# Patient Record
Sex: Female | Born: 2000 | Race: Black or African American | Hispanic: No | Marital: Single | Smoking: Current every day smoker
Health system: Southern US, Community
[De-identification: ages and names within clinical notes are randomized; demographics above are authoritative.]

## PROBLEM LIST (undated history)

## (undated) DIAGNOSIS — F99 Mental disorder, not otherwise specified: Secondary | ICD-10-CM

## (undated) DIAGNOSIS — O039 Complete or unspecified spontaneous abortion without complication: Secondary | ICD-10-CM

## (undated) DIAGNOSIS — J45909 Unspecified asthma, uncomplicated: Secondary | ICD-10-CM

## (undated) HISTORY — DX: Mental disorder, not otherwise specified: F99

## (undated) HISTORY — DX: Complete or unspecified spontaneous abortion without complication: O03.9

---
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
## 2020-02-11 ENCOUNTER — Other Ambulatory Visit: Payer: Self-pay

## 2020-02-11 ENCOUNTER — Emergency Department (HOSPITAL_COMMUNITY)
Admission: EM | Admit: 2020-02-11 | Discharge: 2020-02-11 | Disposition: A | Discharge status: Home / Self Care | Payer: Medicaid Other | Attending: Emergency Medicine | Admitting: Emergency Medicine

## 2020-02-11 ENCOUNTER — Emergency Department (HOSPITAL_COMMUNITY): Payer: Medicaid Other

## 2020-02-11 DIAGNOSIS — R0602 Shortness of breath: Secondary | ICD-10-CM | POA: Diagnosis not present

## 2020-02-11 DIAGNOSIS — R509 Fever, unspecified: Secondary | ICD-10-CM | POA: Diagnosis not present

## 2020-02-11 DIAGNOSIS — Z1152 Encounter for screening for COVID-19: Secondary | ICD-10-CM

## 2020-02-11 DIAGNOSIS — Z20822 Contact with and (suspected) exposure to covid-19: Secondary | ICD-10-CM | POA: Insufficient documentation

## 2020-02-11 DIAGNOSIS — R197 Diarrhea, unspecified: Secondary | ICD-10-CM | POA: Diagnosis not present

## 2020-02-11 DIAGNOSIS — J029 Acute pharyngitis, unspecified: Secondary | ICD-10-CM | POA: Insufficient documentation

## 2020-02-11 DIAGNOSIS — R000 Tachycardia, unspecified: Secondary | ICD-10-CM | POA: Diagnosis not present

## 2020-02-11 DIAGNOSIS — Z87891 Personal history of nicotine dependence: Secondary | ICD-10-CM | POA: Diagnosis not present

## 2020-02-11 DIAGNOSIS — J3489 Other specified disorders of nose and nasal sinuses: Secondary | ICD-10-CM | POA: Diagnosis not present

## 2020-02-11 DIAGNOSIS — M791 Myalgia, unspecified site: Secondary | ICD-10-CM | POA: Diagnosis not present

## 2020-02-11 DIAGNOSIS — J45909 Unspecified asthma, uncomplicated: Secondary | ICD-10-CM | POA: Diagnosis not present

## 2020-02-11 DIAGNOSIS — R0981 Nasal congestion: Secondary | ICD-10-CM | POA: Diagnosis not present

## 2020-02-11 LAB — POC SARS CORONAVIRUS 2 AG -  ED: SARS Coronavirus 2 Ag: NEGATIVE

## 2020-02-11 MED ORDER — ACETAMINOPHEN 325 MG PO TABS
650.0000 mg | ORAL_TABLET | Freq: Once | ORAL | Status: AC
  Administered 2020-02-11: 650 mg via ORAL
  Filled 2020-02-11: qty 2

## 2020-02-11 NOTE — ED Triage Notes (Signed)
Possible covid

## 2020-02-11 NOTE — Discharge Instructions (Addendum)
Your chest x-ray today did not show evidence of pneumonia.  Your Covid test is pending.  The results should be back within 24 hours.  You may review the results on MyChart app or someone from the hospital will likely contact you if your results are positive.  Please isolate at home for 5 days if your test is positive.  On day 5, if you are feeling better and no longer have a fever (without taking medication to reduce your fever) you may return to normal activities but continue to wear a mask around others.  If on day 5 you are still having symptoms you will need to isolate at home for the full 10 days.  You may take over-the-counter Imodium for your diarrhea symptoms and I would recommend alternating Tylenol and ibuprofen every 4 and 6 hours for fever and body aches.  Return emergency department if you develop any worsening symptoms such as shortness of breath.

## 2020-02-11 NOTE — ED Notes (Signed)
Ambulated in room Sats 100% on RA.

## 2020-02-11 NOTE — ED Provider Notes (Signed)
[facility] EMERGENCY DEPARTMENT Provider Note   CSN: [id number] Arrival date & time: 02/11/20  1354     History No chief complaint on file.   Darlene Stout is a 20 y.o. female.  HPI      Darlene Stout is a 20 y.o. female who presents to the Emergency Department complaining of 3 to 4-day history of fever, sore throat, nasal congestion, rhinorrhea and generalized body aches.  Developed diarrhea yesterday.  Intermittently taking ibuprofen with minimal relief.  States that she has some difficulty sleeping due to sweats and body aches.  Drinking small amounts of fluids, has decreased appetite.  Stools have been watery and brown in color.  Nonbloody or black.  No vomiting or abdominal pain.  She also reports having occasional  shortness of breath with exertion.  No chest pain, cough or wheezing.  No history of asthma.  Concerned that she may have Covid.  Denies known exposure.  She is not vaccinated against COVID-19.   Past Medical History:  Diagnosis Date  . Asthma   . Mental disorder    depressed at times  . Miscarriage     Patient Active Problem List   Diagnosis Date Noted  . History of miscarriage 08/22/2016  . Encounter for other contraceptive management 08/22/2016  . Mood disorder (HCC) 10/01/2014    No past surgical history on file.   OB History    Gravida  1   Para  0   Term  0   Preterm  0   AB  1   Living  0     SAB  1   IAB  0   Ectopic  0   Multiple  0   Live Births  0           Family History  Problem Relation Age of Onset  . Asthma Mother   . Lupus Mother   . Diabetes Mother   . Hypertension Mother   . Pancreatitis Mother   . Healthy Sister   . Diabetes Maternal Grandmother   . Hypertension Maternal Grandmother   . Hypertension Maternal Grandfather   . Healthy Sister   . Healthy Brother     Social History   Tobacco Use  . Smoking status: Former Smoker    Types: Cigarettes  . Smokeless tobacco: Never Used   Substance Use Topics  . Alcohol use: No  . Drug use: No    Types: Marijuana    Home Medications Prior to Admission medications   Medication Sig Start Date End Date Taking? Authorizing Provider  ferrous sulfate (SLOW IRON) 160 (50 Fe) MG TBCR SR tablet Take 1 tablet (160 mg total) by mouth daily. 10/17/19   Harris, Cammy Copa, PA-C  ibuprofen (ADVIL,MOTRIN) 400 MG tablet Take 1 tablet (400 mg total) by mouth every 6 (six) hours as needed. 06/27/16   Linwood Dibbles, MD  megestrol (MEGACE) 40 MG tablet 3 tabs a day for 5 days(all at once) 2 tabs a day for 5 days(all at once) then 1 tablet a day 10/17/19   Arthor Captain, PA-C  tranexamic acid (LYSTEDA) 650 MG TABS tablet Take 2 tablets 3 times a day for 5 days , 2 tablets 2 times a day for 5 days, and then 1 tablet daily. 10/17/19   Arthor Captain, PA-C    Allergies    Patient has no known allergies.  Review of Systems   Review of Systems  Constitutional: Positive for appetite change, chills and fever. Negative  for fatigue.  HENT: Positive for congestion, rhinorrhea and sore throat. Negative for trouble swallowing.   Respiratory: Positive for shortness of breath. Negative for cough and wheezing.   Cardiovascular: Negative for chest pain and palpitations.  Gastrointestinal: Positive for diarrhea. Negative for abdominal pain, blood in stool, nausea and vomiting.  Genitourinary: Negative for dysuria and flank pain.  Musculoskeletal: Positive for myalgias. Negative for arthralgias, back pain, neck pain and neck stiffness.  Skin: Negative for rash.  Neurological: Negative for dizziness, speech difficulty, weakness, numbness and headaches.  Hematological: Does not bruise/bleed easily.    Physical Exam Updated Vital Signs BP 118/70   Pulse (!) 108   Temp (!) 101.9 F (38.8 C)   Resp (!) 22   Ht 5\' 3"  (1.6 m)   Wt 94.4 kg   LMP 02/11/2020   SpO2 100%   BMI 36.87 kg/m   Physical Exam Vitals and nursing note reviewed.  Constitutional:       General: She is not in acute distress.    Appearance: Normal appearance. She is not ill-appearing or toxic-appearing.  HENT:     Nose: No rhinorrhea.     Mouth/Throat:     Mouth: Mucous membranes are moist.     Pharynx: Oropharynx is clear. No oropharyngeal exudate or posterior oropharyngeal erythema.     Comments: No significant erythema of the oropharynx.  No exudates or vesicles.  Uvula is midline and nonedematous.  No tonsillar exudate or enlargement. Cardiovascular:     Rate and Rhythm: Tachycardia present.     Pulses: Normal pulses.  Pulmonary:     Effort: Pulmonary effort is normal. No respiratory distress.     Breath sounds: Normal breath sounds. No wheezing or rales.  Chest:     Chest wall: No tenderness.  Abdominal:     Palpations: Abdomen is soft.     Tenderness: There is no abdominal tenderness.  Musculoskeletal:        General: Normal range of motion.     Cervical back: Normal range of motion.     Right lower leg: No edema.     Left lower leg: No edema.  Lymphadenopathy:     Cervical: No cervical adenopathy.  Skin:    General: Skin is warm.     Capillary Refill: Capillary refill takes less than 2 seconds.     Findings: No rash.  Neurological:     General: No focal deficit present.     Mental Status: She is alert.     Sensory: No sensory deficit.     Motor: No weakness.     ED Results / Procedures / Treatments   Labs (all labs ordered are listed, but only abnormal results are displayed) Labs Reviewed  SARS CORONAVIRUS 2 (TAT 6-24 HRS)  POC SARS CORONAVIRUS 2 AG -  ED    EKG None  Radiology DG Chest Portable 1 View  Result Date: 02/11/2020 CLINICAL DATA:  Fever, shortness of breath. EXAM: PORTABLE CHEST 1 VIEW COMPARISON:  September 11, 2011. FINDINGS: The heart size and mediastinal contours are within normal limits. Both lungs are clear. No pneumothorax or pleural effusion is noted. The visualized skeletal structures are unremarkable. IMPRESSION: No  active disease. Electronically Signed   By: September 13, 2011 M.D.   On: 02/11/2020 15:21    Procedures Procedures   Medications Ordered in ED Medications  acetaminophen (TYLENOL) tablet 650 mg (has no administration in time range)    ED Course  I have reviewed the triage  vital signs and the nursing notes.  Pertinent labs & imaging results that were available during my care of the patient were reviewed by me and considered in my medical decision making (see chart for details).    MDM Rules/Calculators/A&P                          Patient here with 3 to 4-day history of symptoms suggestive of Covid.  No known exposures, but patient admits to exposure to public.  She is not vaccinated.  Endorses fever at home, intermittently taking ibuprofen.  Reports having some intermittent shortness of breath with exertion.  Mildly tachycardic on arrival but also febrile with fever of 101.9.  She was given Tylenol here.  On exam, patient well-appearing, nontoxic.  No respiratory distress.  Lungs are clear on exam.  She is ambulatory in the room without hypoxia.  Clinical suspicion for PE is low.  Covid infection suspected.  Antigen test negative, PCR testing pending.  Patient agrees to isolation instructions as discussed.  Repeat vitals tachycardia now resolved after tylenol and oral fluids. I feel that she is appropriate for discharge home.  Return precautions given.  Darlene Stout was evaluated in Emergency Department on 02/11/2020 for the symptoms described in the history of present illness. She was evaluated in the context of the global COVID-19 pandemic, which necessitated consideration that the patient might be at risk for infection with the SARS-CoV-2 virus that causes COVID-19. Institutional protocols and algorithms that pertain to the evaluation of patients at risk for COVID-19 are in a state of rapid change based on information released by regulatory bodies including the CDC and federal and state  organizations. These policies and algorithms were followed during the patient's care in the ED.  Final Clinical Impression(s) / ED Diagnoses Final diagnoses:  Encounter for screening for COVID-19    Rx / DC Orders ED Discharge Orders    None       Pauline Aus, PA-C 02/11/20 1705    Terrilee Files, MD 02/11/20 2049

## 2020-02-12 LAB — SARS CORONAVIRUS 2 (TAT 6-24 HRS): SARS Coronavirus 2: NEGATIVE

## 2020-03-09 DIAGNOSIS — R1110 Vomiting, unspecified: Secondary | ICD-10-CM | POA: Diagnosis not present

## 2020-03-09 DIAGNOSIS — R1032 Left lower quadrant pain: Secondary | ICD-10-CM | POA: Diagnosis not present

## 2020-03-09 DIAGNOSIS — R1031 Right lower quadrant pain: Secondary | ICD-10-CM | POA: Diagnosis not present

## 2020-03-09 DIAGNOSIS — K529 Noninfective gastroenteritis and colitis, unspecified: Secondary | ICD-10-CM | POA: Diagnosis not present

## 2020-03-09 DIAGNOSIS — K5900 Constipation, unspecified: Secondary | ICD-10-CM | POA: Diagnosis not present

## 2020-03-09 DIAGNOSIS — D649 Anemia, unspecified: Secondary | ICD-10-CM | POA: Diagnosis not present

## 2020-03-09 DIAGNOSIS — A599 Trichomoniasis, unspecified: Secondary | ICD-10-CM | POA: Diagnosis not present

## 2020-03-09 DIAGNOSIS — R109 Unspecified abdominal pain: Secondary | ICD-10-CM | POA: Diagnosis not present

## 2020-03-10 ENCOUNTER — Telehealth: Payer: Self-pay | Admitting: *Deleted

## 2020-03-10 NOTE — Telephone Encounter (Signed)
Transition Care Management Unsuccessful Follow-up Telephone Call  Date of discharge and from where:  03/09/2020 [facility] ED  Attempts:  1st Attempt  Reason for unsuccessful TCM follow-up call:  Unable to reach patient

## 2020-03-11 NOTE — Telephone Encounter (Signed)
Transition Care Management Unsuccessful Follow-up Telephone Call  Date of discharge and from where:  03/09/2020 [facility] ED  Attempts:  2nd Attempt  Reason for unsuccessful TCM follow-up call:  Unable to reach patient

## 2020-03-14 NOTE — Telephone Encounter (Signed)
Transition Care Management Unsuccessful Follow-up Telephone Call  Date of discharge and from where:  03/09/2020 [facility] ED  Attempts:  3rd Attempt  Reason for unsuccessful TCM follow-up call:  Unable to reach patient

## 2021-01-30 ENCOUNTER — Other Ambulatory Visit (HOSPITAL_COMMUNITY)
Admission: RE | Admit: 2021-01-30 | Discharge: 2021-01-30 | Disposition: A | Discharge status: Home / Self Care | Payer: Medicaid Other | Source: Ambulatory Visit | Attending: Adult Health | Admitting: Adult Health

## 2021-01-30 ENCOUNTER — Encounter: Payer: Self-pay | Admitting: Adult Health

## 2021-01-30 ENCOUNTER — Ambulatory Visit (INDEPENDENT_AMBULATORY_CARE_PROVIDER_SITE_OTHER): Payer: Medicaid Other | Admitting: Adult Health

## 2021-01-30 ENCOUNTER — Other Ambulatory Visit: Payer: Self-pay

## 2021-01-30 VITALS — BP 124/77 | HR 96 | Ht 63.5 in | Wt 229.0 lb

## 2021-01-30 DIAGNOSIS — F32A Depression, unspecified: Secondary | ICD-10-CM | POA: Insufficient documentation

## 2021-01-30 DIAGNOSIS — R300 Dysuria: Secondary | ICD-10-CM | POA: Diagnosis not present

## 2021-01-30 DIAGNOSIS — F419 Anxiety disorder, unspecified: Secondary | ICD-10-CM

## 2021-01-30 DIAGNOSIS — N920 Excessive and frequent menstruation with regular cycle: Secondary | ICD-10-CM | POA: Diagnosis not present

## 2021-01-30 DIAGNOSIS — B964 Proteus (mirabilis) (morganii) as the cause of diseases classified elsewhere: Secondary | ICD-10-CM | POA: Insufficient documentation

## 2021-01-30 DIAGNOSIS — Z01419 Encounter for gynecological examination (general) (routine) without abnormal findings: Secondary | ICD-10-CM

## 2021-01-30 DIAGNOSIS — N921 Excessive and frequent menstruation with irregular cycle: Secondary | ICD-10-CM

## 2021-01-30 DIAGNOSIS — N898 Other specified noninflammatory disorders of vagina: Secondary | ICD-10-CM | POA: Insufficient documentation

## 2021-01-30 DIAGNOSIS — Z862 Personal history of diseases of the blood and blood-forming organs and certain disorders involving the immune mechanism: Secondary | ICD-10-CM | POA: Diagnosis not present

## 2021-01-30 DIAGNOSIS — Z124 Encounter for screening for malignant neoplasm of cervix: Secondary | ICD-10-CM | POA: Insufficient documentation

## 2021-01-30 LAB — POCT URINALYSIS DIPSTICK
Glucose, UA: NEGATIVE
Ketones, UA: NEGATIVE
Nitrite, UA: NEGATIVE
Protein, UA: NEGATIVE

## 2021-01-30 MED ORDER — PRENATAL PLUS 27-1 MG PO TABS: 1.0000 | ORAL_TABLET | Freq: Every day | ORAL | 12 refills | Status: DC

## 2021-01-30 NOTE — Progress Notes (Signed)
Patient ID: Darlene Stout, female   DOB: 2000/08/20, 21 y.o.   MRN: [id number] History of Present Illness: Darlene Stout is a 21 year old black female,single, G1P0010, in for a well woman gyn exam and pap. She has burnign with urination and vaginal discharge with odor she says.   Current Medications, Allergies, Past Medical History, Past Surgical History, Family History and Social History were reviewed in [employer] record.     Review of Systems: Patient denies any headaches, hearing loss, fatigue, blurred vision, shortness of breath, chest pain, abdominal pain, problems with bowel movements,  or intercourse. No joint pain or Stout swings.  She says periods heavy at times and not regular She also wants to get pregnant See HPI for more positives   Physical Exam:BP 124/77 (BP Location: Left Arm, Patient Position: Sitting, Cuff Size: Large)    Pulse 96    Ht 5' 3.5" (1.613 m)    Wt 229 lb (103.9 kg)    LMP 01/16/2021 (Approximate)    BMI 39.93 kg/m  urine dipstick 2+leuks and 1+blood. General:  Well developed, well nourished, no acute distress Skin:  Warm and dry Neck:  Midline trachea, normal thyroid, good ROM, no lymphadenopathy Lungs; Clear to auscultation bilaterally Breast:  No dominant palpable mass, retraction, or nipple discharge Cardiovascular: Regular rate and rhythm Abdomen:  Soft, non tender, no hepatosplenomegaly Pelvic:  External genitalia is normal in appearance, no lesions.  The vagina is normal in appearance. Urethra has no lesions or masses. The cervix is smooth, pap with GC/CHL performed.  Uterus is felt to be normal size, shape, and contour.  No adnexal masses or tenderness noted.Bladder is non tender, no masses felt. Rectal: Good sphincter tone, no polyps, or hemorrhoids felt.  Hemoccult negative. Extremities/musculoskeletal:  No swelling or varicosities noted, no clubbing or cyanosis Psych:  No Stout changes, alert and cooperative,seems happy AA is  4 Fall risk is moderate Depression screen [facility] 2/9 01/30/2021 05/31/2016  Decreased Interest 2 2  Down, Depressed, Hopeless 3 1  PHQ - 2 Score 5 3  Altered sleeping 2 1  Tired, decreased energy 3 1  Change in appetite 2 1  Feeling bad or failure about yourself  3 2  Trouble concentrating 2 3  Moving slowly or fidgety/restless 3 0  Suicidal thoughts 0 0  PHQ-9 Score 20 11    GAD 7 : Generalized Anxiety Score 01/30/2021  Nervous, Anxious, on Edge 0  Control/stop worrying 2  Worry too much - different things 1  Trouble relaxing 1  Restless 0  Easily annoyed or irritable 3  Afraid - awful might happen 3  Total GAD 7 Score 10    [place] - 01/30/21 1439       Pregnancy Intention Screening   Does the patient want to become pregnant in the next year? Yes    Does the patient's partner want to become pregnant in the next year? Yes    Would the patient like to discuss contraceptive options today? No      Contraception Wrap Up   Current Method Female Condom    End Method Female Condom            Examination chaperoned by Darlene Mood LPN    Impression and Plan: 1. Routine cervical smear Pap sent  - Cytology - PAP( [facility])  2. Burning with urination [place] C&S sent to rule out UTI - POCT Urinalysis Dipstick - Urinalysis, Routine w reflex microscopic - Urine Culture  3. Encounter for gynecological examination with Papanicolaou smear of cervix Pap sent Physical in 1 year Pap in 3 if normal  Get front office to help with MY CHART   4. Menorrhagia with irregular cycle Will check labs She declines OCs, wants to get pregnant  Will talk when labs back  - CBC - Comprehensive metabolic panel - TSH  5. History of anemia Will check CBC Will start PNV Meds ordered this encounter  Medications   prenatal vitamin w/FE, FA (PRENATAL 1 + 1) 27-1 MG TABS tablet    Sig: Take 1 tablet by mouth daily at 12 noon.    Dispense:  30 tablet    Refill:  12    Order Specific Question:    Supervising Provider    Answer:   Duane Lope H [[id number]]    - CBC  6. Anxiety and depression She declines meds but is open to counseling, will refer to [facility] - Ambulatory referral to [facility]

## 2021-01-31 LAB — COMPREHENSIVE METABOLIC PANEL
ALT: 28 IU/L (ref 0–32)
AST: 26 IU/L (ref 0–40)
Albumin/Globulin Ratio: 1 — ABNORMAL LOW (ref 1.2–2.2)
Albumin: 4 g/dL (ref 3.9–5.0)
Alkaline Phosphatase: 158 IU/L — ABNORMAL HIGH (ref 42–106)
BUN/Creatinine Ratio: 20 (ref 9–23)
BUN: 13 mg/dL (ref 6–20)
Bilirubin Total: <0.2 mg/dL (ref 0.0–1.2)
CO2: 20 mmol/L (ref 20–29)
Calcium: 9.6 mg/dL (ref 8.7–10.2)
Chloride: 104 mmol/L (ref 96–106)
Creatinine, Ser: 0.64 mg/dL (ref 0.57–1.00)
Globulin, Total: 4.1 g/dL (ref 1.5–4.5)
Glucose: 90 mg/dL (ref 70–99)
Potassium: 4.9 mmol/L (ref 3.5–5.2)
Sodium: 138 mmol/L (ref 134–144)
Total Protein: 8.1 g/dL (ref 6.0–8.5)
eGFR: 130 mL/min/{1.73_m2} (ref >59)

## 2021-01-31 LAB — TSH: TSH: 1.38 u[IU]/mL (ref 0.450–4.500)

## 2021-01-31 LAB — URINALYSIS, ROUTINE W REFLEX MICROSCOPIC
Bilirubin, UA: NEGATIVE
Glucose, UA: NEGATIVE
Ketones, UA: NEGATIVE
Nitrite, UA: NEGATIVE
Specific Gravity, UA: 1.024 (ref 1.005–1.030)
Urobilinogen, Ur: 2 mg/dL — ABNORMAL HIGH (ref 0.2–1.0)
pH, UA: 7.5 (ref 5.0–7.5)

## 2021-01-31 LAB — CBC
Hematocrit: 36.6 % (ref 34.0–46.6)
Hemoglobin: 11.1 g/dL (ref 11.1–15.9)
MCH: 22.5 pg — ABNORMAL LOW (ref 26.6–33.0)
MCHC: 30.3 g/dL — ABNORMAL LOW (ref 31.5–35.7)
MCV: 74 fL — ABNORMAL LOW (ref 79–97)
Platelets: 248 10*3/uL (ref 150–450)
RBC: 4.94 x10E6/uL (ref 3.77–5.28)
RDW: 17.6 % — ABNORMAL HIGH (ref 11.7–15.4)
WBC: 8.2 10*3/uL (ref 3.4–10.8)

## 2021-01-31 LAB — MICROSCOPIC EXAMINATION
Casts: NONE SEEN /lpf
WBC, UA: >30 /hpf — AB (ref 0–5)

## 2021-02-01 LAB — CYTOLOGY - PAP
Chlamydia: NEGATIVE
Comment: NEGATIVE
Comment: NORMAL
Diagnosis: NEGATIVE
Neisseria Gonorrhea: NEGATIVE

## 2021-02-02 ENCOUNTER — Telehealth: Payer: Self-pay | Admitting: Adult Health

## 2021-02-02 ENCOUNTER — Other Ambulatory Visit: Payer: Self-pay | Admitting: Adult Health

## 2021-02-02 LAB — URINE CULTURE

## 2021-02-02 MED ORDER — SULFAMETHOXAZOLE-TRIMETHOPRIM 800-160 MG PO TABS: 1.0000 | ORAL_TABLET | Freq: Two times a day (BID) | ORAL | 0 refills | Status: DC

## 2021-02-02 NOTE — Progress Notes (Signed)
Urine culture is + will rx septra ds

## 2021-02-02 NOTE — Telephone Encounter (Signed)
Pt aware has UTI and that septra ds sent to [facility], push fluids and pap normal

## 2021-03-01 NOTE — Progress Notes (Signed)
I agree with that urine culture,01/30/21, for Proteus Mirabilis and treated patient  with septra ds.

## 2021-03-24 DIAGNOSIS — M79642 Pain in left hand: Secondary | ICD-10-CM | POA: Diagnosis not present

## 2021-03-24 DIAGNOSIS — M79645 Pain in left finger(s): Secondary | ICD-10-CM | POA: Diagnosis not present

## 2021-03-24 DIAGNOSIS — W228XXA Striking against or struck by other objects, initial encounter: Secondary | ICD-10-CM | POA: Diagnosis not present

## 2021-03-27 ENCOUNTER — Telehealth: Payer: Self-pay

## 2021-03-27 NOTE — Telephone Encounter (Signed)
Transition Care Management Unsuccessful Follow-up Telephone Call ? ?Date of discharge and from where:  03/24/2021 from [facility] ? ?Attempts:  1st Attempt ? ?Reason for unsuccessful TCM follow-up call:  Missing or invalid number ? ? ? ?

## 2021-03-29 NOTE — Telephone Encounter (Signed)
Transition Care Management Unsuccessful Follow-up Telephone Call ? ?Date of discharge and from where:  03/24/2021 from [facility] ? ?Attempts:  2nd Attempt ? ?Reason for unsuccessful TCM follow-up call:  Missing or invalid number ? ? ? ?

## 2021-03-30 NOTE — Telephone Encounter (Signed)
Transition Care Management Unsuccessful Follow-up Telephone Call ? ?Date of discharge and from where:  03/24/2021 from [facility] ? ?Attempts:  3rd Attempt ? ?Reason for unsuccessful TCM follow-up call:  Unable to reach patient ? ? ? ?

## 2022-04-25 ENCOUNTER — Emergency Department (HOSPITAL_COMMUNITY)
Admission: EM | Admit: 2022-04-25 | Discharge: 2022-04-25 | Disposition: A | Discharge status: Home / Self Care | Payer: Medicaid Other | Attending: Student | Admitting: Student

## 2022-04-25 ENCOUNTER — Other Ambulatory Visit: Payer: Self-pay

## 2022-04-25 ENCOUNTER — Encounter (HOSPITAL_COMMUNITY): Payer: Self-pay

## 2022-04-25 ENCOUNTER — Emergency Department (HOSPITAL_COMMUNITY): Payer: Medicaid Other

## 2022-04-25 DIAGNOSIS — Y9389 Activity, other specified: Secondary | ICD-10-CM | POA: Diagnosis not present

## 2022-04-25 DIAGNOSIS — S99911A Unspecified injury of right ankle, initial encounter: Secondary | ICD-10-CM | POA: Diagnosis present

## 2022-04-25 DIAGNOSIS — X501XXA Overexertion from prolonged static or awkward postures, initial encounter: Secondary | ICD-10-CM | POA: Diagnosis not present

## 2022-04-25 DIAGNOSIS — Y92480 Sidewalk as the place of occurrence of the external cause: Secondary | ICD-10-CM | POA: Insufficient documentation

## 2022-04-25 DIAGNOSIS — J029 Acute pharyngitis, unspecified: Secondary | ICD-10-CM | POA: Insufficient documentation

## 2022-04-25 DIAGNOSIS — J45909 Unspecified asthma, uncomplicated: Secondary | ICD-10-CM | POA: Insufficient documentation

## 2022-04-25 DIAGNOSIS — F17210 Nicotine dependence, cigarettes, uncomplicated: Secondary | ICD-10-CM | POA: Diagnosis not present

## 2022-04-25 DIAGNOSIS — S93401A Sprain of unspecified ligament of right ankle, initial encounter: Secondary | ICD-10-CM | POA: Insufficient documentation

## 2022-04-25 DIAGNOSIS — M7989 Other specified soft tissue disorders: Secondary | ICD-10-CM | POA: Diagnosis not present

## 2022-04-25 DIAGNOSIS — S93491A Sprain of other ligament of right ankle, initial encounter: Secondary | ICD-10-CM | POA: Diagnosis not present

## 2022-04-25 LAB — GROUP A STREP BY PCR: Group A Strep by PCR: NOT DETECTED

## 2022-04-25 MED ORDER — BACITRACIN-NEOMYCIN-POLYMYXIN OINTMENT TUBE
TOPICAL_OINTMENT | Freq: Two times a day (BID) | CUTANEOUS | Status: DC
  Filled 2022-04-25: qty 14

## 2022-04-25 MED ORDER — NEOMYCIN-POLYMYXIN-PRAMOXINE 1 % EX CREA: TOPICAL_CREAM | Freq: Two times a day (BID) | CUTANEOUS | Status: DC

## 2022-04-25 MED ORDER — NAPROXEN 250 MG PO TABS
500.0000 mg | ORAL_TABLET | Freq: Once | ORAL | Status: AC
  Administered 2022-04-25: 500 mg via ORAL
  Filled 2022-04-25: qty 2

## 2022-04-25 MED ORDER — IBUPROFEN 400 MG PO TABS
600.0000 mg | ORAL_TABLET | Freq: Once | ORAL | Status: AC
  Administered 2022-04-25: 600 mg via ORAL
  Filled 2022-04-25: qty 1

## 2022-04-25 NOTE — Progress Notes (Signed)
Orthopedic Tech Progress Note Patient Details:  Darlene Stout April 02, 2000 [id number]  Ortho Devices Type of Ortho Device: Crutches, Ankle Air splint Ortho Device/Splint Location: RLE Ortho Device/Splint Interventions: Ordered, Application, Adjustment   Post Interventions Patient Tolerated: Well Instructions Provided: Poper ambulation with device, Care of device, Adjustment of device  [name] 04/25/2022, 11:10 PM

## 2022-04-25 NOTE — ED Triage Notes (Signed)
Patient came in POV from home. Reports chasing a dog and fell on sidewalk. Right ankle twisted Swelling noted and abrasion on left knee.   Denies hitting head.  Patient also reports that throat "swells" while asleep & when wakes up has pain with swallowing.

## 2022-04-25 NOTE — ED Provider Triage Note (Signed)
Emergency Medicine Provider Triage Evaluation Note  Darlene Stout , a 22 y.o. female  was evaluated in triage.  Pt complains of ankle injury and sore throat.  Patient was chasing her dog, fell and twisted the right ankle.  Now with swelling and pain primarily to the lateral aspect of the right ankle.  Also has a abrasion on her left knee.  Still able to ambulate and bear weight.  Also mention she has a sore throat this been going on for 2 days.  Endorses subjective fever at home and a cough.  Denies sick contacts.  Review of Systems  Positive: See above Negative: See above  Physical Exam  BP 126/80   Pulse 97   Temp 97.8 F (36.6 C) (Oral)   Ht  (1.6 m)   Wt 107 kg   SpO2 100%   BMI 41.81 kg/m  Gen:   Awake, no distress   Resp:  Normal effort  MSK:   Moves extremities without difficulty  Other:    Medical Decision Making  Medically screening exam initiated at 9:11 PM.  Appropriate orders placed.  Grier Mitts was informed that the remainder of the evaluation will be completed by another provider, this initial triage assessment does not replace that evaluation, and the importance of remaining in the ED until their evaluation is complete.     Gareth Eagle, PA-C 04/25/22 2112

## 2022-04-26 NOTE — ED Provider Notes (Signed)
[place] EMERGENCY DEPARTMENT AT [facility] Provider Note  CSN: [id number] Arrival date & time: 04/25/22 2054  Chief Complaint(s) Ankle Pain and Sore Throat  HPI Darlene Stout is a 22 y.o. female with PMH asthma who presents emergency department for evaluation of ankle pain and sore throat.  Patient states that she was chasing her dog and twisted her right ankle and causing pain and swelling.  She also endorses 2 to 3 days of sore throat that has been improving but would like to be tested for strep today.  Denies numbness, tingling, weakness of lower extremity and she is able to bear weight on it limited secondary to pain only.  Denies chest pain, shortness of breath, headache, fever or other systemic symptoms.   Past Medical History Past Medical History:  Diagnosis Date   Asthma    Mental disorder    depressed at times   Miscarriage    Patient Active Problem List   Diagnosis Date Noted   Encounter for gynecological examination with Papanicolaou smear of cervix 01/30/2021   Burning with urination 01/30/2021   Menorrhagia with irregular cycle 01/30/2021   Routine cervical smear 01/30/2021   Anxiety and depression 01/30/2021   History of anemia 01/30/2021   History of miscarriage 08/22/2016   Encounter for other contraceptive management 08/22/2016   Mood disorder 10/01/2014   Home Medication(s) Prior to Admission medications   Medication Sig Start Date End Date Taking? Authorizing Provider  prenatal vitamin w/FE, FA (PRENATAL 1 + 1) 27-1 MG TABS tablet Take 1 tablet by mouth daily at 12 noon. Patient not taking: Reported on 04/25/2022 01/30/21   Cyril Mourning A, NP  sulfamethoxazole-trimethoprim (BACTRIM DS) 800-160 MG tablet Take 1 tablet by mouth 2 (two) times daily. Take 1 bid Patient not taking: Reported on 04/25/2022 02/02/21   Adline Potter, NP                                                                                                                                     Past Surgical History History reviewed. No pertinent surgical history. Family History Family History  Problem Relation Age of Onset   Asthma Mother    Lupus Mother    Diabetes Mother    Hypertension Mother    Pancreatitis Mother    Healthy Sister    Diabetes Maternal Grandmother    Hypertension Maternal Grandmother    Hypertension Maternal Grandfather    Healthy Sister    Healthy Brother     Social History Social History   Tobacco Use   Smoking status: Every Day    Types: E-cigarettes   Smokeless tobacco: Never  Vaping Use   Vaping Use: Every day  Substance Use Topics   Alcohol use: Yes    Comment: occ   Drug use: No    Types: Marijuana   Allergies Patient has no known allergies.  Review of Systems Review  of Systems  HENT:  Positive for sore throat.   Musculoskeletal:  Positive for arthralgias.    Physical Exam Vital Signs  I have reviewed the triage vital signs BP 126/80   Pulse 97   Temp 97.8 F (36.6 C) (Oral)   Ht  (1.6 m)   Wt 107 kg   SpO2 100%   BMI 41.81 kg/m   Physical Exam Vitals and nursing note reviewed.  Constitutional:      General: She is not in acute distress.    Appearance: She is well-developed.  HENT:     Head: Normocephalic and atraumatic.     Mouth/Throat:     Tonsils: 1+ on the right. 1+ on the left.  Eyes:     Conjunctiva/sclera: Conjunctivae normal.  Cardiovascular:     Rate and Rhythm: Normal rate and regular rhythm.     Heart sounds: No murmur heard. Pulmonary:     Effort: Pulmonary effort is normal. No respiratory distress.  Musculoskeletal:        General: Swelling and tenderness present.     Cervical back: Neck supple.  Skin:    General: Skin is warm and dry.     Capillary Refill: Capillary refill takes less than 2 seconds.  Neurological:     Mental Status: She is alert.  Psychiatric:        Mood and Affect: Mood normal.     ED Results and Treatments Labs (all labs ordered  are listed, but only abnormal results are displayed) Labs Reviewed  GROUP A STREP BY PCR                                                                                                                          Radiology DG Ankle Complete Right  Result Date: 04/25/2022 CLINICAL DATA:  Bowl ankle wall walking dog. Pain and swelling over the lateral malleolus. EXAM: RIGHT ANKLE - COMPLETE 3 VIEW COMPARISON:  None Available. FINDINGS: There is no evidence of fracture, dislocation, or joint effusion. Peroni is ossicles. Lateral soft tissue swelling. IMPRESSION: Soft tissue swelling without acute fracture. Electronically Signed   By: Tiburcio Pea M.D.   On: 04/25/2022 21:27    Pertinent labs & imaging results that were available during my care of the patient were reviewed by me and considered in my medical decision making (see MDM for details).  Medications Ordered in ED Medications  ibuprofen (ADVIL) tablet 600 mg (600 mg Oral Given 04/25/22 2123)  naproxen (NAPROSYN) tablet 500 mg (500 mg Oral Given 04/25/22 2236)  Procedures .Ortho Injury Treatment  Date/Time: 04/26/2022 9:39 AM  Performed by: Glendora Score, MD Authorized by: Glendora Score, MD   Consent:    Consent obtained:  Verbal   Consent given by:  Patient   Risks discussed:  Fracture, nerve damage, restricted joint movement and vascular damage   Alternatives discussed:  No treatment and alternative treatmentInjury location: ankle Location details: right ankle Injury type: sprain. Pre-procedure distal perfusion: normal Pre-procedure neurological function: normal Pre-procedure range of motion: reduced Immobilization: brace Splint type: ankle stirrup Splint Applied by: ED Nurse Post-procedure distal perfusion: normal Post-procedure neurological function: normal Post-procedure range of motion:  improved     (including critical care time)  Medical Decision Making / ED Course   This patient presents to the ED for concern of sore throat and ankle pain, this involves an extensive number of treatment options, and is a complaint that carries with it a high risk of complications and morbidity.  The differential diagnosis includes fracture, sprain, localized edema, gravity dependent edema, strep throat, unspecified viral URI, mononucleosis  MDM: Patient seen emergency room for evaluation of multiple complaints described above.  Physical exam with mild swelling and tenderness to the right ankle, oropharyngeal erythema and mild tonsillar swelling but no exudate.  Strep negative.  X-ray without fracture.  Patient presentation consistent with pharyngitis and to a right ankle sprain.  Patient placed in an air cast and given crutches as well as Toradol for pain control.  Patient then discharged with outpatient follow-up.  She does not meet inpatient criteria for admission.   Additional history obtained:  -External records from outside source obtained and reviewed including: Chart review including previous notes, labs, imaging, consultation notes   Lab Tests: -I ordered, reviewed, and interpreted labs.   The pertinent results include:   Labs Reviewed  GROUP A STREP BY PCR        Imaging Studies ordered: I ordered imaging studies including XR ankle I independently visualized and interpreted imaging. I agree with the radiologist interpretation   Medicines ordered and prescription drug management: Meds ordered this encounter  Medications   ibuprofen (ADVIL) tablet 600 mg   naproxen (NAPROSYN) tablet 500 mg   DISCONTD: neomycin-polymyxin-pramoxine (NEOSPORIN PLUS) cream   DISCONTD: neomycin-bacitracin-polymyxin (NEOSPORIN) ointment    -I have reviewed the patients home medicines and have made adjustments as needed  Critical interventions none   Cardiac Monitoring: The  patient was maintained on a cardiac monitor.  I personally viewed and interpreted the cardiac monitored which showed an underlying rhythm of: NSr  Social Determinants of Health:  Factors impacting patients care include: none   Reevaluation: After the interventions noted above, I reevaluated the patient and found that they have :improved  Co morbidities that complicate the patient evaluation  Past Medical History:  Diagnosis Date   Asthma    Mental disorder    depressed at times   Miscarriage       Dispostion: I considered admission for this patient, but at this time she does not meet inpatient criteria for admission she is safe for discharge with outpatient follow-up     Final Clinical Impression(s) / ED Diagnoses Final diagnoses:  Sprain of right ankle, unspecified ligament, initial encounter  Pharyngitis, unspecified etiology     @    Glendora Score, MD 04/26/22 [phone]

## 2023-02-20 ENCOUNTER — Encounter (HOSPITAL_COMMUNITY): Payer: Self-pay | Admitting: Emergency Medicine

## 2023-02-20 ENCOUNTER — Emergency Department (HOSPITAL_COMMUNITY)
Admission: EM | Admit: 2023-02-20 | Discharge: 2023-02-21 | Disposition: A | Discharge status: Home / Self Care | Payer: Medicaid Other | Attending: Emergency Medicine | Admitting: Emergency Medicine

## 2023-02-20 ENCOUNTER — Other Ambulatory Visit: Payer: Self-pay

## 2023-02-20 DIAGNOSIS — R1030 Lower abdominal pain, unspecified: Secondary | ICD-10-CM | POA: Diagnosis not present

## 2023-02-20 DIAGNOSIS — N939 Abnormal uterine and vaginal bleeding, unspecified: Secondary | ICD-10-CM

## 2023-02-20 LAB — CBC WITH DIFFERENTIAL/PLATELET
Abs Immature Granulocytes: 0.03 10*3/uL (ref 0.00–0.07)
Basophils Absolute: 0 10*3/uL (ref 0.0–0.1)
Basophils Relative: 0 %
Eosinophils Absolute: 0.1 10*3/uL (ref 0.0–0.5)
Eosinophils Relative: 1 %
HCT: 37.7 % (ref 36.0–46.0)
Hemoglobin: 11.4 g/dL — ABNORMAL LOW (ref 12.0–15.0)
Immature Granulocytes: 0 %
Lymphocytes Relative: 26 %
Lymphs Abs: 2.7 10*3/uL (ref 0.7–4.0)
MCH: 25.1 pg — ABNORMAL LOW (ref 26.0–34.0)
MCHC: 30.2 g/dL (ref 30.0–36.0)
MCV: 82.9 fL (ref 80.0–100.0)
Monocytes Absolute: 0.8 10*3/uL (ref 0.1–1.0)
Monocytes Relative: 7 %
Neutro Abs: 6.9 10*3/uL (ref 1.7–7.7)
Neutrophils Relative %: 66 %
Platelets: 261 10*3/uL (ref 150–400)
RBC: 4.55 MIL/uL (ref 3.87–5.11)
RDW: 14.5 % (ref 11.5–15.5)
WBC: 10.5 10*3/uL (ref 4.0–10.5)
nRBC: 0 % (ref 0.0–0.2)

## 2023-02-20 LAB — URINALYSIS, ROUTINE W REFLEX MICROSCOPIC
Bacteria, UA: NONE SEEN
RBC / HPF: >50 RBC/hpf (ref 0–5)

## 2023-02-20 LAB — COMPREHENSIVE METABOLIC PANEL
ALT: 24 U/L (ref 0–44)
AST: 20 U/L (ref 15–41)
Albumin: 3.4 g/dL — ABNORMAL LOW (ref 3.5–5.0)
Alkaline Phosphatase: 108 U/L (ref 38–126)
Anion gap: 9 (ref 5–15)
BUN: 7 mg/dL (ref 6–20)
CO2: 26 mmol/L (ref 22–32)
Calcium: 9.4 mg/dL (ref 8.9–10.3)
Chloride: 105 mmol/L (ref 98–111)
Creatinine, Ser: 0.59 mg/dL (ref 0.44–1.00)
GFR, Estimated: >60 mL/min (ref >60)
Glucose, Bld: 86 mg/dL (ref 70–99)
Potassium: 4.2 mmol/L (ref 3.5–5.1)
Sodium: 140 mmol/L (ref 135–145)
Total Bilirubin: 0.4 mg/dL (ref 0.0–1.2)
Total Protein: 8 g/dL (ref 6.5–8.1)

## 2023-02-20 LAB — POC URINE PREG, ED: Preg Test, Ur: NEGATIVE

## 2023-02-20 NOTE — ED Triage Notes (Signed)
Patient reports heavy vaginal bleeding x 4 days and cramping, patient reports heavier bleeding that normal.  Patient reports going through 3 pads in the past 2 hours.  Patient reports history of same.

## 2023-02-21 MED ORDER — IBUPROFEN 800 MG PO TABS
800.0000 mg | ORAL_TABLET | Freq: Once | ORAL | Status: AC
  Administered 2023-02-21: 800 mg via ORAL
  Filled 2023-02-21: qty 1

## 2023-02-21 NOTE — ED Notes (Signed)
Awaiting patient from lobby.

## 2023-02-21 NOTE — Discharge Instructions (Signed)
Evaluation today was overall reassuring.  Suspect your vaginal bleeding is related to your having a period.  Recommend ibuprofen for treatment at home.  You can also take Tylenol to help with abdominal cramps as well.  Also recommend he follow-up with OB/GYN.  If you start to soak 1 pad per hour or experience shortness of breath, lightheadedness fatigue or chest pain, abdominal pain or any other concerning symptom please return emergency department further evaluation.

## 2023-02-21 NOTE — ED Provider Notes (Signed)
[place] EMERGENCY DEPARTMENT AT [facility] Provider Note   CSN: [id number] Arrival date & time: 02/20/23  2204     History  Chief Complaint  Patient presents with   Vaginal Bleeding   HPI Darlene Stout is a 23 y.o. female with history of menorrhagia with irregular cycle presenting for vaginal bleeding.  States she started her period 4 days ago and started to have vaginal bleeding but has noted that it has been notably heavier than normal.  She reports that she has had to use approximately 3 pads in the past 2 hours and since she has been here it has been more like 1 pad every 2 hours.  States she is having some lower abdominal cramping but no discernible localized pain.  Denies vomiting diarrhea.  Denies urinary symptoms.  Denies shortness of breath, chest pain, lightheadedness and fatigue.   Vaginal Bleeding      Home Medications Prior to Admission medications   Medication Sig Start Date End Date Taking? Authorizing Provider  ibuprofen (ADVIL) 200 MG tablet Take 200 mg by mouth every 6 (six) hours as needed for mild pain (pain score 1-3).   Yes [provider]      Allergies    Patient has no known allergies.    Review of Systems   Review of Systems  Genitourinary:  Positive for vaginal bleeding.    Physical Exam Updated Vital Signs BP 119/83 (BP Location: Right Arm)   Pulse 84   Temp 98.5 F (36.9 C) (Oral)   Resp 14   Wt 104.3 kg   LMP 02/17/2023 (Exact Date)   SpO2 99%   BMI 40.74 kg/m  Physical Exam Vitals and nursing note reviewed. Exam conducted with a chaperone present.  HENT:     Head: Normocephalic and atraumatic.     Mouth/Throat:     Mouth: Mucous membranes are moist.  Eyes:     General:        Right eye: No discharge.        Left eye: No discharge.     Conjunctiva/sclera: Conjunctivae normal.  Cardiovascular:     Rate and Rhythm: Normal rate and regular rhythm.     Pulses: Normal pulses.     Heart sounds: Normal  heart sounds.  Pulmonary:     Effort: Pulmonary effort is normal.     Breath sounds: Normal breath sounds.  Abdominal:     General: Abdomen is flat. There is no distension.     Palpations: Abdomen is soft.     Tenderness: There is no abdominal tenderness.  Genitourinary:    General: Normal vulva.     Comments: Blood noted about the inner and outer labia and vaginal introitus.  Small amount of blood in the vaginal vault and a small clot noted at the cervical os.  The os is closed.  No evidence of trauma to the vaginal canal or cervix.  No notable active hemorrhage. Skin:    General: Skin is warm and dry.  Neurological:     General: No focal deficit present.  Psychiatric:        Mood and Affect: Mood normal.     ED Results / Procedures / Treatments   Labs (all labs ordered are listed, but only abnormal results are displayed) Labs Reviewed  CBC WITH DIFFERENTIAL/PLATELET - Abnormal; Notable for the following components:      Result Value   Hemoglobin 11.4 (*)    MCH 25.1 (*)  All other components within normal limits  COMPREHENSIVE METABOLIC PANEL - Abnormal; Notable for the following components:   Albumin 3.4 (*)    All other components within normal limits  URINALYSIS, ROUTINE W REFLEX MICROSCOPIC - Abnormal; Notable for the following components:   Color, Urine RED (*)    APPearance TURBID (*)    Glucose, UA   (*)    Value: TEST NOT REPORTED DUE TO COLOR INTERFERENCE OF URINE PIGMENT   Hgb urine dipstick   (*)    Value: TEST NOT REPORTED DUE TO COLOR INTERFERENCE OF URINE PIGMENT   Bilirubin Urine   (*)    Value: TEST NOT REPORTED DUE TO COLOR INTERFERENCE OF URINE PIGMENT   Ketones, ur   (*)    Value: TEST NOT REPORTED DUE TO COLOR INTERFERENCE OF URINE PIGMENT   Protein, ur   (*)    Value: TEST NOT REPORTED DUE TO COLOR INTERFERENCE OF URINE PIGMENT   Nitrite   (*)    Value: TEST NOT REPORTED DUE TO COLOR INTERFERENCE OF URINE PIGMENT   Leukocytes,Ua   (*)    Value:  TEST NOT REPORTED DUE TO COLOR INTERFERENCE OF URINE PIGMENT   All other components within normal limits  POC URINE PREG, ED - Normal    EKG None  Radiology No results found.  Procedures Procedures    Medications Ordered in ED Medications  ibuprofen (ADVIL) tablet 800 mg (has no administration in time range)    ED Course/ Medical Decision Making/ A&P                                 Medical Decision Making Amount and/or Complexity of Data Reviewed Labs: ordered.   23 year old well-appearing female presenting for vaginal bleeding.  Exam notable for small amount of blood in the vaginal vault with a small clot noted at the cervical os but otherwise reassuring.  DDx includes ectopic pregnancy, ruptured ovarian cyst, ovarian torsion, vaginal hemorrhage or trauma, foreign body, symptomatic anemia.  On serial reassessments, patient remained hemodynamically stable, no acute distress well-appearing.  Labs revealed mild anemia, hemoglobin was 11.4.  Considered symptomatic anemia but unlikely given lack of symptoms, stable vitals and reassuring hemoglobin.  Doubt ectopic pregnancy due to negative pregnancy test.  Doubt ovarian pathology given no tenderness in the abdomen.  Suspect this is menorrhagia.  Advised NSAIDs for treatment.  Also advised her to follow-up with OB/GYN.  Discussed return precautions.  Discharged good condition.         Final Clinical Impression(s) / ED Diagnoses Final diagnoses:  Vaginal bleeding    Rx / DC Orders ED Discharge Orders     None         Gareth Eagle, PA-C 02/21/23 0955    Lonell Grandchild, MD 02/21/23 1527

## 2023-02-22 ENCOUNTER — Emergency Department (HOSPITAL_COMMUNITY)
Admission: EM | Admit: 2023-02-22 | Discharge: 2023-02-23 | Disposition: A | Discharge status: Home / Self Care | Payer: Medicaid Other | Attending: Emergency Medicine | Admitting: Emergency Medicine

## 2023-02-22 ENCOUNTER — Encounter (HOSPITAL_COMMUNITY): Payer: Self-pay

## 2023-02-22 ENCOUNTER — Other Ambulatory Visit: Payer: Self-pay

## 2023-02-22 DIAGNOSIS — R109 Unspecified abdominal pain: Secondary | ICD-10-CM | POA: Diagnosis not present

## 2023-02-22 DIAGNOSIS — N12 Tubulo-interstitial nephritis, not specified as acute or chronic: Secondary | ICD-10-CM | POA: Diagnosis not present

## 2023-02-22 DIAGNOSIS — R112 Nausea with vomiting, unspecified: Secondary | ICD-10-CM | POA: Diagnosis not present

## 2023-02-22 DIAGNOSIS — R58 Hemorrhage, not elsewhere classified: Secondary | ICD-10-CM | POA: Diagnosis not present

## 2023-02-22 DIAGNOSIS — R1084 Generalized abdominal pain: Secondary | ICD-10-CM | POA: Diagnosis not present

## 2023-02-22 NOTE — ED Triage Notes (Signed)
Pt BIB EMS from home with reports of abdominal and lower back cramping from her period. Pt reports being on her period x 1 week and reports a hx of abnormal periods.

## 2023-02-23 ENCOUNTER — Emergency Department (HOSPITAL_COMMUNITY): Payer: Medicaid Other

## 2023-02-23 DIAGNOSIS — R109 Unspecified abdominal pain: Secondary | ICD-10-CM | POA: Diagnosis not present

## 2023-02-23 LAB — CBC WITH DIFFERENTIAL/PLATELET
Abs Immature Granulocytes: 0.03 10*3/uL (ref 0.00–0.07)
Basophils Absolute: 0 10*3/uL (ref 0.0–0.1)
Basophils Relative: 0 %
Eosinophils Absolute: 0.1 10*3/uL (ref 0.0–0.5)
Eosinophils Relative: 1 %
HCT: 36.1 % (ref 36.0–46.0)
Hemoglobin: 10.7 g/dL — ABNORMAL LOW (ref 12.0–15.0)
Immature Granulocytes: 0 %
Lymphocytes Relative: 39 %
Lymphs Abs: 3.5 10*3/uL (ref 0.7–4.0)
MCH: 25.2 pg — ABNORMAL LOW (ref 26.0–34.0)
MCHC: 29.6 g/dL — ABNORMAL LOW (ref 30.0–36.0)
MCV: 84.9 fL (ref 80.0–100.0)
Monocytes Absolute: 0.5 10*3/uL (ref 0.1–1.0)
Monocytes Relative: 5 %
Neutro Abs: 5 10*3/uL (ref 1.7–7.7)
Neutrophils Relative %: 55 %
Platelets: 252 10*3/uL (ref 150–400)
RBC: 4.25 MIL/uL (ref 3.87–5.11)
RDW: 14.3 % (ref 11.5–15.5)
WBC: 9.1 10*3/uL (ref 4.0–10.5)
nRBC: 0 % (ref 0.0–0.2)

## 2023-02-23 LAB — COMPREHENSIVE METABOLIC PANEL
ALT: 22 U/L (ref 0–44)
AST: 18 U/L (ref 15–41)
Albumin: 3.6 g/dL (ref 3.5–5.0)
Alkaline Phosphatase: 104 U/L (ref 38–126)
Anion gap: 9 (ref 5–15)
BUN: 11 mg/dL (ref 6–20)
CO2: 25 mmol/L (ref 22–32)
Calcium: 9.2 mg/dL (ref 8.9–10.3)
Chloride: 107 mmol/L (ref 98–111)
Creatinine, Ser: 0.59 mg/dL (ref 0.44–1.00)
GFR, Estimated: >60 mL/min (ref >60)
Glucose, Bld: 91 mg/dL (ref 70–99)
Potassium: 3.5 mmol/L (ref 3.5–5.1)
Sodium: 141 mmol/L (ref 135–145)
Total Bilirubin: 0.6 mg/dL (ref 0.0–1.2)
Total Protein: 8.2 g/dL — ABNORMAL HIGH (ref 6.5–8.1)

## 2023-02-23 LAB — URINALYSIS, ROUTINE W REFLEX MICROSCOPIC
Bacteria, UA: NONE SEEN
Bilirubin Urine: NEGATIVE
Glucose, UA: 50 mg/dL — AB
Ketones, ur: NEGATIVE mg/dL
Nitrite: NEGATIVE
Protein, ur: 100 mg/dL — AB
RBC / HPF: >50 RBC/hpf (ref 0–5)
Specific Gravity, Urine: 1.024 (ref 1.005–1.030)
pH: 5 (ref 5.0–8.0)

## 2023-02-23 LAB — HCG, SERUM, QUALITATIVE: Preg, Serum: NEGATIVE

## 2023-02-23 LAB — LIPASE, BLOOD: Lipase: 28 U/L (ref 11–51)

## 2023-02-23 MED ORDER — CEPHALEXIN 500 MG PO CAPS
500.0000 mg | ORAL_CAPSULE | Freq: Once | ORAL | Status: AC
  Administered 2023-02-23: 500 mg via ORAL
  Filled 2023-02-23: qty 1

## 2023-02-23 MED ORDER — KETOROLAC TROMETHAMINE 15 MG/ML IJ SOLN
15.0000 mg | Freq: Once | INTRAMUSCULAR | Status: AC
  Administered 2023-02-23: 15 mg via INTRAVENOUS
  Filled 2023-02-23: qty 1

## 2023-02-23 MED ORDER — IOHEXOL 300 MG/ML  SOLN
100.0000 mL | Freq: Once | INTRAMUSCULAR | Status: AC | PRN
  Administered 2023-02-23: 100 mL via INTRAVENOUS

## 2023-02-23 MED ORDER — CEPHALEXIN 500 MG PO CAPS: 500.0000 mg | ORAL_CAPSULE | Freq: Four times a day (QID) | ORAL | 0 refills | Status: AC

## 2023-02-23 MED ORDER — ONDANSETRON HCL 4 MG/2ML IJ SOLN
4.0000 mg | Freq: Once | INTRAMUSCULAR | Status: AC
  Administered 2023-02-23: 4 mg via INTRAVENOUS
  Filled 2023-02-23: qty 2

## 2023-02-23 MED ORDER — FENTANYL CITRATE PF 50 MCG/ML IJ SOSY
50.0000 ug | PREFILLED_SYRINGE | Freq: Once | INTRAMUSCULAR | Status: AC
  Administered 2023-02-23: 50 ug via INTRAVENOUS
  Filled 2023-02-23: qty 1

## 2023-02-23 NOTE — ED Provider Notes (Signed)
[place] EMERGENCY DEPARTMENT AT [facility] Provider Note   CSN: [id number] Arrival date & time: 02/22/23  2148     History  Chief Complaint  Patient presents with   Abdominal Cramping    Darlene Stout is a 23 y.o. female.  The history is provided by the patient.  Patient presents with ongoing abdominal cramping.  This has  been ongoing for about 4 days. She reports it is worse than typical cramping with her menstrual cycle.  No fevers but reports nausea.  Reports continued heavy vaginal bleeding.  No dysuria.  No previous abdominal surgeries Recently seen for this pain in the ER, but is worsening    Home Medications Prior to Admission medications   Medication Sig Start Date End Date Taking? Authorizing Provider  cephALEXin (KEFLEX) 500 MG capsule Take 1 capsule (500 mg total) by mouth 4 (four) times daily. 02/23/23  Yes Zadie Rhine, MD  ibuprofen (ADVIL) 200 MG tablet Take 200 mg by mouth every 6 (six) hours as needed for mild pain (pain score 1-3).    [provider]      Allergies    Patient has no known allergies.    Review of Systems   Review of Systems  Constitutional:  Negative for fever.  Gastrointestinal:  Positive for abdominal pain and nausea.  Genitourinary:  Positive for vaginal bleeding. Negative for dysuria.    Physical Exam Updated Vital Signs BP 119/75   Pulse 82   Temp 98.8 F (37.1 C) (Oral)   Resp 20   LMP 02/17/2023 (Exact Date)   SpO2 100%  Physical Exam CONSTITUTIONAL: Well developed/well nourished, uncomfortable appearing HEAD: Normocephalic/atraumatic ENMT: Mucous membranes moist NECK: supple no meningeal signs CV: S1/S2 noted, no murmurs/rubs/gallops noted LUNGS: Lungs are clear to auscultation bilaterally, no apparent distress ABDOMEN: soft, diffuse moderate tenderness, no rebound or guarding, bowel sounds noted throughout abdomen GU:no cva tenderness NEURO: Pt is awake/alert/appropriate, moves all  extremitiesx4.  No facial droop.   EXTREMITIES: pulses normal/equal, full ROM SKIN: warm, color normal   ED Results / Procedures / Treatments   Labs (all labs ordered are listed, but only abnormal results are displayed) Labs Reviewed  COMPREHENSIVE METABOLIC PANEL - Abnormal; Notable for the following components:      Result Value   Total Protein 8.2 (*)    All other components within normal limits  CBC WITH DIFFERENTIAL/PLATELET - Abnormal; Notable for the following components:   Hemoglobin 10.7 (*)    MCH 25.2 (*)    MCHC 29.6 (*)    All other components within normal limits  URINALYSIS, ROUTINE W REFLEX MICROSCOPIC - Abnormal; Notable for the following components:   Color, Urine STRAW (*)    APPearance CLOUDY (*)    Glucose, UA 50 (*)    Hgb urine dipstick LARGE (*)    Protein, ur 100 (*)    Leukocytes,Ua TRACE (*)    All other components within normal limits  LIPASE, BLOOD  HCG, SERUM, QUALITATIVE    EKG None  Radiology CT ABDOMEN PELVIS W CONTRAST Result Date: 02/23/2023 CLINICAL DATA:  Acute, nonlocalized abdominal pain EXAM: CT ABDOMEN AND PELVIS WITH CONTRAST TECHNIQUE: Multidetector CT imaging of the abdomen and pelvis was performed using the standard protocol following bolus administration of intravenous contrast. RADIATION DOSE REDUCTION: This exam was performed according to the departmental dose-optimization program which includes automated exposure control, adjustment of the mA and/or kV according to patient size and/or use of iterative reconstruction technique. CONTRAST:  OMNIPAQUE IOHEXOL 300 MG/ML  SOLN COMPARISON:  None Available. FINDINGS: Lower chest:  No contributory findings. Hepatobiliary: No focal liver abnormality.No evidence of biliary obstruction or stone. Pancreas: Unremarkable. Spleen: Unremarkable. Adrenals/Urinary Tract: Negative adrenals. Area of hypoenhancing cortex in the upper pole right kidney. No hydronephrosis or stone. Unremarkable  bladder. Stomach/Bowel:  No obstruction. No appendicitis. Vascular/Lymphatic: No acute vascular abnormality. No mass or adenopathy. Reproductive:No pathologic findings. Other: No ascites or pneumoperitoneum. Musculoskeletal: No acute abnormalities. IMPRESSION: Area of hypoenhancing right renal cortex, correlate for pyelonephritis symptoms/labs. Electronically Signed   By: Tiburcio Pea M.D.   On: 02/23/2023 05:49    Procedures Procedures    Medications Ordered in ED Medications  ketorolac (TORADOL) 15 MG/ML injection 15 mg (has no administration in time range)  cephALEXin (KEFLEX) capsule 500 mg (has no administration in time range)  fentaNYL (SUBLIMAZE) injection 50 mcg (50 mcg Intravenous Given 02/23/23 0347)  ondansetron (ZOFRAN) injection 4 mg (4 mg Intravenous Given 02/23/23 0346)  iohexol (OMNIPAQUE) 300 MG/ML solution 100 mL (100 mLs Intravenous Contrast Given 02/23/23 0525)    ED Course/ Medical Decision Making/ A&P Clinical Course as of 02/23/23 0617  Sat Feb 23, 2023  0311 Patient repeat ER visit.  Recently seen for abdominal cramping and bleeding.  Now having worsening diffuse abdominal pain and continued bleeding.  Will obtain CT imaging to exclude other causes [DW]  [phone] Patient feeling improved. Imaging negative for appendicitis.  May have pyelonephritis, but overall well-appearing, no signs of sepsis  Patient safe for discharge home.  She reports the pain is improving  Will place on Keflex for possible pyelonephritis. [DW]    Clinical Course User Index [DW] Zadie Rhine, MD                                 Medical Decision Making Amount and/or Complexity of Data Reviewed Labs: ordered. Radiology: ordered.  Risk Prescription drug management.   This patient presents to the ED for concern of abdominal pain, this involves an extensive number of treatment options, and is a complaint that carries with it a high risk of complications and morbidity.  The differential  diagnosis includes but is not limited to cholecystitis, cholelithiasis, pancreatitis, gastritis, peptic ulcer disease, appendicitis, bowel obstruction, bowel perforation, diverticulitis, ectopic pregnancy, PID, TOA    Comorbidities that complicate the patient evaluation: Patient's presentation is complicated by their history of obesity  Social Determinants of Health: Patient's impaired access to primary care  increases the complexity of managing their presentation  Additional history obtained: Records reviewed  GYN results reviewed  Lab Tests: I Ordered, and personally interpreted labs.  The pertinent results include: Mild anemia  Imaging Studies ordered: I ordered imaging studies including CT scan abdomen pelvis   I independently visualized and interpreted imaging which showed ? Pyelonephritis I agree with the radiologist interpretation   Medicines ordered and prescription drug management: I ordered medication including fentanyl for pain Reevaluation of the patient after these medicines showed that the patient    improved    Reevaluation: After the interventions noted above, I reevaluated the patient and found that they have :improved  Complexity of problems addressed: Patient's presentation is most consistent with  acute presentation with potential threat to life or bodily function  Disposition: After consideration of the diagnostic results and the patient's response to treatment,  I feel that the patent would benefit from discharge   .  Final Clinical Impression(s) / ED Diagnoses Final diagnoses:  Pyelonephritis    Rx / DC Orders ED Discharge Orders          Ordered    cephALEXin (KEFLEX) 500 MG capsule  4 times daily        02/23/23 1914              Zadie Rhine, MD 02/23/23 [phone]

## 2023-09-19 ENCOUNTER — Emergency Department (HOSPITAL_COMMUNITY)
Admission: EM | Admit: 2023-09-19 | Discharge: 2023-09-19 | Disposition: A | Discharge status: Home / Self Care | Attending: Emergency Medicine | Admitting: Emergency Medicine

## 2023-09-19 DIAGNOSIS — J45909 Unspecified asthma, uncomplicated: Secondary | ICD-10-CM | POA: Insufficient documentation

## 2023-09-19 DIAGNOSIS — N939 Abnormal uterine and vaginal bleeding, unspecified: Secondary | ICD-10-CM | POA: Insufficient documentation

## 2023-09-19 LAB — WET PREP, GENITAL
Clue Cells Wet Prep HPF POC: NONE SEEN
Sperm: NONE SEEN
Trich, Wet Prep: NONE SEEN
WBC, Wet Prep HPF POC: <10 (ref <10)
Yeast Wet Prep HPF POC: NONE SEEN

## 2023-09-19 LAB — CBC WITH DIFFERENTIAL/PLATELET
Abs Immature Granulocytes: 0.01 K/uL (ref 0.00–0.07)
Basophils Absolute: 0 K/uL (ref 0.0–0.1)
Basophils Relative: 0 %
Eosinophils Absolute: 0.2 K/uL (ref 0.0–0.5)
Eosinophils Relative: 3 %
HCT: 31.5 % — ABNORMAL LOW (ref 36.0–46.0)
Hemoglobin: 9.5 g/dL — ABNORMAL LOW (ref 12.0–15.0)
Immature Granulocytes: 0 %
Lymphocytes Relative: 31 %
Lymphs Abs: 2.6 K/uL (ref 0.7–4.0)
MCH: 25.1 pg — ABNORMAL LOW (ref 26.0–34.0)
MCHC: 30.2 g/dL (ref 30.0–36.0)
MCV: 83.1 fL (ref 80.0–100.0)
Monocytes Absolute: 0.6 K/uL (ref 0.1–1.0)
Monocytes Relative: 7 %
Neutro Abs: 4.9 K/uL (ref 1.7–7.7)
Neutrophils Relative %: 59 %
Platelets: 226 K/uL (ref 150–400)
RBC: 3.79 MIL/uL — ABNORMAL LOW (ref 3.87–5.11)
RDW: 14.4 % (ref 11.5–15.5)
WBC: 8.3 K/uL (ref 4.0–10.5)
nRBC: 0 % (ref 0.0–0.2)

## 2023-09-19 LAB — HCG, SERUM, QUALITATIVE: Preg, Serum: NEGATIVE

## 2023-09-19 MED ORDER — MEDROXYPROGESTERONE ACETATE 10 MG PO TABS: 20.0000 mg | ORAL_TABLET | Freq: Three times a day (TID) | ORAL | 0 refills | Status: AC

## 2023-09-19 NOTE — ED Provider Notes (Signed)
 [place] EMERGENCY DEPARTMENT AT [facility] Provider Note   CSN: [id number] Arrival date & time: 09/19/23  2057     Patient presents with: Vaginal Bleeding   Darlene Stout is a 23 y.o. female.  Vaginal bleeding for past mo Goes through 6 pads a day No dizziness or lightheadedness  Lower abd cramping 6-7/10 for past two weeks. No otc meds today. No NV  Last menses was when she came in in feb  {Add pertinent medical, surgical, social history, OB history to YEP:67052}  Vaginal Bleeding      Prior to Admission medications   Medication Sig Start Date End Date Taking? Authorizing Provider  medroxyPROGESTERone  (PROVERA ) 10 MG tablet Take 2 tablets (20 mg total) by mouth 3 (three) times daily for 7 days. 09/19/23 09/26/23 Yes Minnie Tinnie BRAVO, PA  cephALEXin  (KEFLEX ) 500 MG capsule Take 1 capsule (500 mg total) by mouth 4 (four) times daily. 02/23/23   Midge Golas, MD  ibuprofen  (ADVIL ) 200 MG tablet Take 200 mg by mouth every 6 (six) hours as needed for mild pain (pain score 1-3).    [provider]    Allergies: Patient has no known allergies.    Review of Systems  Genitourinary:  Positive for vaginal bleeding.    Updated Vital Signs BP 126/83 (BP Location: Right Arm)   Pulse 82   Temp 98 F (36.7 C) (Oral)   Resp 16   SpO2 100%   Physical Exam Vitals and nursing note reviewed.  Constitutional:      General: She is not in acute distress.    Appearance: Normal appearance.  HENT:     Head: Normocephalic and atraumatic.  Eyes:     Conjunctiva/sclera: Conjunctivae normal.  Cardiovascular:     Rate and Rhythm: Normal rate.  Pulmonary:     Effort: Pulmonary effort is normal. No respiratory distress.  Genitourinary:    Comments: Blood in vaginal vault. No CMT Skin:    Coloration: Skin is not jaundiced or pale.  Neurological:     Mental Status: She is alert and oriented to person, place, and time. Mental status is at baseline.   GU  exam chaperoned by Cornelius Sharps NT  (all labs ordered are listed, but only abnormal results are displayed) Labs Reviewed  CBC WITH DIFFERENTIAL/PLATELET - Abnormal; Notable for the following components:      Result Value   RBC 3.79 (*)    Hemoglobin 9.5 (*)    HCT 31.5 (*)    MCH 25.1 (*)    All other components within normal limits  WET PREP, GENITAL  HCG, SERUM, QUALITATIVE  GC/CHLAMYDIA PROBE AMP ([place]) NOT AT [facility]    EKG: None  Radiology: No results found.  {Document cardiac monitor, telemetry assessment procedure when appropriate:32947} Procedures   Medications Ordered in the ED - No data to display    {Click here for ABCD2, HEART and other calculators REFRESH Note before signing:1}                              Medical Decision Making Amount and/or Complexity of Data Reviewed Labs: ordered.   Patient presents to the ED for concern of vaginal bleeding, this involves an extensive number of treatment options, and is a complaint that carries with it a high risk of complications and morbidity.  The differential diagnosis includes pregnancy, miscarriage, ectopic pregnancy, symptomatic anemia, prolonged menses   Co morbidities that  complicate the patient evaluation  See HPI   Additional history obtained:  Additional history obtained from Nursing and Outside Medical Records   External records from outside source obtained and reviewed including triage note, ED note from 02/20/2023 with similar complaints of vaginal bleeding   Lab Tests:  I Ordered, and personally interpreted labs.  The pertinent results include:   Hgb 9.5 hCG negative    Medicines ordered and prescription drug management:  I ordered medication including provera   for AUB  I have reviewed the patients home medicines and have made adjustments as needed    Problem List / ED Course:  Vaginal bleeding Hemodynamically stable with no tachycardia nor hypotension. Hgb 9.5 (was 10.7 six  months ago.  Baseline appears to be 6.1-11.4 over past 7 years) Mild RLQ, LLQ abdominal tenderness. Doubt ectopic as it is not unilateral and she is not pregnant   Reevaluation:  After the interventions noted above, I reevaluated the patient and found that they have :stayed the same   Social Determinants of Health:  Current tobacco abuse   Dispostion:  After consideration of the diagnostic results and the patients response to treatment, I feel that the patent would benefit from patient management with OB/GYN follow-up.   Discussed ED workup, disposition, return to ED precautions with patient who expresses understanding agrees with plan.  All questions answered to their satisfaction.  They are agreeable to plan.  Discharge instructions provided on paperwork  Final diagnoses:  Vaginal bleeding    ED Discharge Orders          Ordered    medroxyPROGESTERone  (PROVERA ) 10 MG tablet  3 times daily        09/19/23 2248

## 2023-09-19 NOTE — ED Triage Notes (Signed)
 Patient arrived with complaint of being on her menstrual cycle for roughly a month straight. Reports irregular periods and this happening in the past. Requesting prescription for birth control pills.

## 2023-09-19 NOTE — Discharge Instructions (Addendum)
 Thank you for letting us  evaluate you today.  You may review your results on MyChart for other test that are now pending.  Will call you if you need to be treated for something.  Please follow-up with OB/GYN next week.  Let them know that you are taking an antibleeding medicine due to vaginal bleeding and you need to be seen next week  I have sent an antibleeding medicine to your pharmacy.  Please take as prescribed and do not miss a dose.  You need to be seen in OB/GYN clinic either during or following taking this  I have also provided you with primary care provider follow-up.  Please establish care with them for routine medical complaints, annual visits  Return to Emergency Department if you experience lightheadedness, dizziness, paleness

## 2023-09-20 LAB — GC/CHLAMYDIA PROBE AMP (~~LOC~~) NOT AT ARMC
Chlamydia: NEGATIVE
Comment: NEGATIVE
Comment: NORMAL
Neisseria Gonorrhea: NEGATIVE
